# Patient Record
Sex: Female | Born: 1995 | Hispanic: Yes | Marital: Single | Smoking: Never smoker
Health system: Southern US, Community
[De-identification: ages and names within clinical notes are randomized; demographics above are authoritative.]

---

## 2015-04-16 ENCOUNTER — Ambulatory Visit (INDEPENDENT_AMBULATORY_CARE_PROVIDER_SITE_OTHER): Payer: 59 | Admitting: Family Medicine

## 2015-04-16 VITALS — BP 122/80 | HR 85 | Temp 98.1°F | Resp 16 | Ht 63.0 in | Wt 203.6 lb

## 2015-04-16 DIAGNOSIS — Z111 Encounter for screening for respiratory tuberculosis: Secondary | ICD-10-CM

## 2015-04-16 DIAGNOSIS — Z23 Encounter for immunization: Secondary | ICD-10-CM | POA: Diagnosis not present

## 2015-04-16 NOTE — Patient Instructions (Addendum)
To be a valid skin test you need to return in 48-72 hours which would be between about 1 PM Sunday and 1 PM Monday. We close at 4 PM on Sunday.  Return if problems   Influenza Vaccine (Flu Vaccine, Inactivated or Recombinant) 2014-2015: What You Need to Know 1. Why get vaccinated? Influenza ("flu") is a contagious disease that spreads around the [place] every winter, usually between October and May. Flu is caused by influenza viruses, and is spread mainly by coughing, sneezing, and close contact. Anyone can get flu, but the risk of getting flu is highest among children. Symptoms come on suddenly and may last several days. They can include:  fever/chills  sore throat  muscle aches  fatigue  cough  headache  runny or stuffy nose Flu can make some people much sicker than others. These people include young children, people 71 and older, pregnant women, and people with certain health conditions-such as heart, lung or kidney disease, nervous system disorders, or a weakened immune system. Flu vaccination is especially important for these people, and anyone in close contact with them. Flu can also lead to pneumonia, and make existing medical conditions worse. It can cause diarrhea and seizures in children. Each year thousands of people in the [place] die from flu, and many more are hospitalized. Flu vaccine is the best protection against flu and its complications. Flu vaccine also helps prevent spreading flu from person to person. 2. Inactivated and recombinant flu vaccines You are getting an injectable flu vaccine, which is either an "inactivated" or "recombinant" vaccine. These vaccines do not contain any live influenza virus. They are given by injection with a needle, and often called the "flu shot."  A different live, attenuated (weakened) influenza vaccine is sprayed into the nostrils. This vaccine is described in a separate Vaccine Information Statement. Flu vaccination is  recommended every year. Some children 6 months through 27 years of age might need two doses during one year. Flu viruses are always changing. Each year's flu vaccine is made to protect against 3 or 4 viruses that are likely to cause disease that year. Flu vaccine cannot prevent all cases of flu, but it is the best defense against the disease.  It takes about 2 weeks for protection to develop after the vaccination, and protection lasts several months to a year. Some illnesses that are not caused by influenza virus are often mistaken for flu. Flu vaccine will not prevent these illnesses. It can only prevent influenza. Some inactivated flu vaccine contains a very small amount of a mercury-based preservative called thimerosal. Studies have shown that thimerosal in vaccines is not harmful, but flu vaccines that do not contain a preservative are available. 3. Some people should not get this vaccine Tell the person who gives you the vaccine:  If you have any severe, life-threatening allergies. If you ever had a life-threatening allergic reaction after a dose of flu vaccine, or have a severe allergy to any part of this vaccine, including (for example) an allergy to gelatin, antibiotics, or eggs, you may be advised not to get vaccinated. Most, but not all, types of flu vaccine contain a small amount of egg protein.  If you ever had Guillain-Barr Syndrome (a severe paralyzing illness, also called GBS). Some people with a history of GBS should not get this vaccine. This should be discussed with your doctor.  If you are not feeling well. It is usually okay to get flu vaccine when you have a mild  illness, but you might be advised to wait until you feel better. You should come back when you are better. 4. Risks of a vaccine reaction With a vaccine, like any medicine, there is a chance of side effects. These are usually mild and go away on their own. Problems that could happen after any vaccine:  Brief fainting  spells can happen after any medical procedure, including vaccination. Sitting or lying down for about 15 minutes can help prevent fainting, and injuries caused by a fall. Tell your doctor if you feel dizzy, or have vision changes or ringing in the ears.  Severe shoulder pain and reduced range of motion in the arm where a shot was given can happen, very rarely, after a vaccination.  Severe allergic reactions from a vaccine are very rare, estimated at less than 1 in a million doses. If one were to occur, it would usually be within a few minutes to a few hours after the vaccination. Mild problems following inactivated flu vaccine:  soreness, redness, or swelling where the shot was given  hoarseness  sore, red or itchy eyes  cough  fever  aches  headache  itching  fatigue If these problems occur, they usually begin soon after the shot and last 1 or 2 days. Moderate problems following inactivated flu vaccine:  Young children who get inactivated flu vaccine and pneumococcal vaccine (PCV13) at the same time may be at increased risk for seizures caused by fever. Ask your doctor for more information. Tell your doctor if a child who is getting flu vaccine has ever had a seizure. Inactivated flu vaccine does not contain live flu virus, so you cannot get the flu from this vaccine. As with any medicine, there is a very remote chance of a vaccine causing a serious injury or death. The safety of vaccines is always being monitored. For more information, visit: http://floyd.org/ 5. What if there is a serious reaction? What should I look for?  Look for anything that concerns you, such as signs of a severe allergic reaction, very high fever, or behavior changes. Signs of a severe allergic reaction can include hives, swelling of the face and throat, difficulty breathing, a fast heartbeat, dizziness, and weakness. These would start a few minutes to a few hours after the vaccination. What  should I do?  If you think it is a severe allergic reaction or other emergency that can't wait, call 9-1-1 and get the person to the nearest hospital. Otherwise, call your doctor.  Afterward, the reaction should be reported to the Vaccine Adverse Event Reporting System (VAERS). Your doctor should file this report, or you can do it yourself through the VAERS web site at www.vaers.LAgents.no, or by calling 1-501-138-7317. VAERS does not give medical advice. 6. The National Vaccine Injury Compensation Program The [employer] Vaccine Injury Compensation Program (VICP) is a federal program that was created to compensate people who may have been injured by certain vaccines. Persons who believe they may have been injured by a vaccine can learn about the program and about filing a claim by calling 1-(978)795-9622 or visiting the VICP website at SpiritualWord.at. There is a time limit to file a claim for compensation. 7. How can I learn more?  Ask your health care provider.  Call your local or state health department.  Contact the Centers for Disease Control and Prevention (CDC):  Call 907-480-8233 (1-800-CDC-INFO) or  Visit CDC's website at BiotechRoom.com.[facility] Vaccine Information Statement (Interim) Inactivated Influenza Vaccine (03/25/2013) Document Released: 05/18/2006 Document  Revised: 12/08/2013 Document Reviewed: 07/11/2013 [facility] Patient Information 2015 [place], [place]. This information is not intended to replace advice given to you by your health care provider. Make sure you discuss any questions you have with your health care provider.

## 2015-04-16 NOTE — Progress Notes (Signed)
  Subjective:  Patient ID: Christina Whitney, female    DOB: [birth year]  Age: 19 y.o. MRN: [id number]  Flu shot and TB skin test Patient is here for her flu shot and TB skin test. 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 She is basically healthy.  Previous surgery was something on her neck when she was a about 19 years old. She has not had any major medical illnesses. Not on any regular medicines, not allergic to medicines, does not smoke, drink, or use drugs. She is sexually involved with a regular boyfriend and uses condoms for protection.  No history of TB exposure. Only international travel has been to the [place] where she grew out. Parents are living and well.   Objective:   Healthy-appearing young lady in no acute distress. Neck supple without nodes. Throat clear. Chest clear. Heart regular without murmurs  Assessment & Plan:   Assessment:  TB skin test and flu shot Plan:  Her instructions  There are no Patient Instructions on file for this visit.   HOPPER,DAVID, MD 04/16/2015

## 2015-04-18 ENCOUNTER — Ambulatory Visit (INDEPENDENT_AMBULATORY_CARE_PROVIDER_SITE_OTHER): Payer: 59 | Admitting: *Deleted

## 2015-04-18 DIAGNOSIS — Z111 Encounter for screening for respiratory tuberculosis: Secondary | ICD-10-CM

## 2015-04-18 LAB — TB SKIN TEST
Induration: 0 mm
TB Skin Test: NEGATIVE

## 2015-09-06 ENCOUNTER — Ambulatory Visit (INDEPENDENT_AMBULATORY_CARE_PROVIDER_SITE_OTHER): Payer: BLUE CROSS/BLUE SHIELD

## 2015-09-06 ENCOUNTER — Ambulatory Visit (INDEPENDENT_AMBULATORY_CARE_PROVIDER_SITE_OTHER): Payer: BLUE CROSS/BLUE SHIELD | Admitting: Family Medicine

## 2015-09-06 VITALS — BP 118/68 | HR 102 | Temp 99.0°F | Resp 16 | Ht 64.0 in | Wt 207.0 lb

## 2015-09-06 DIAGNOSIS — R109 Unspecified abdominal pain: Secondary | ICD-10-CM

## 2015-09-06 DIAGNOSIS — N390 Urinary tract infection, site not specified: Secondary | ICD-10-CM

## 2015-09-06 DIAGNOSIS — R1084 Generalized abdominal pain: Secondary | ICD-10-CM

## 2015-09-06 DIAGNOSIS — R1031 Right lower quadrant pain: Secondary | ICD-10-CM

## 2015-09-06 DIAGNOSIS — Z113 Encounter for screening for infections with a predominantly sexual mode of transmission: Secondary | ICD-10-CM

## 2015-09-06 DIAGNOSIS — A499 Bacterial infection, unspecified: Secondary | ICD-10-CM

## 2015-09-06 DIAGNOSIS — R140 Abdominal distension (gaseous): Secondary | ICD-10-CM

## 2015-09-06 DIAGNOSIS — R10A0 Flank pain, unspecified side: Secondary | ICD-10-CM

## 2015-09-06 LAB — POCT URINALYSIS DIP (MANUAL ENTRY)
Bilirubin, UA: NEGATIVE
Blood, UA: NEGATIVE
Glucose, UA: NEGATIVE
Ketones, POC UA: NEGATIVE
Nitrite, UA: NEGATIVE
Protein Ur, POC: NEGATIVE
Spec Grav, UA: 1.015
Urobilinogen, UA: 0.2
pH, UA: 8.5

## 2015-09-06 LAB — COMPLETE METABOLIC PANEL WITHOUT GFR
AST: 15 U/L (ref 10–30)
Albumin: 4.4 g/dL (ref 3.6–5.1)
Alkaline Phosphatase: 70 U/L (ref 33–115)
Calcium: 9.8 mg/dL (ref 8.6–10.2)
Chloride: 100 mmol/L (ref 98–110)
GFR, Est African American: >89 mL/min (ref >=60)
GFR, Est Non African American: >89 mL/min (ref >=60)
Potassium: 3.8 mmol/L (ref 3.5–5.3)

## 2015-09-06 LAB — POCT CBC
Granulocyte percent: 71.4 %G (ref 37–80)
HCT, POC: 43.9 % (ref 37.7–47.9)
Hemoglobin: 14.9 g/dL (ref 12.2–16.2)
Lymph, poc: 2.7 (ref 0.6–3.4)
MCH, POC: 29.9 pg (ref 27–31.2)
MCHC: 34 g/dL (ref 31.8–35.4)
MCV: 87.8 fL (ref 80–97)
MID (cbc): 0.2 (ref 0–0.9)
MPV: 6.9 fL (ref 0–99.8)
POC Granulocyte: 7.4 — AB (ref 2–6.9)
POC LYMPH PERCENT: 26.4 % (ref 10–50)
POC MID %: 2.2 %M (ref 0–12)
Platelet Count, POC: 346 10*3/uL (ref 142–424)
RBC: 5 M/uL (ref 4.04–5.48)
RDW, POC: 13 %
WBC: 10.3 10*3/uL — AB (ref 4.6–10.2)

## 2015-09-06 LAB — COMPLETE METABOLIC PANEL WITH GFR
ALT: 20 U/L (ref 6–29)
BUN: 10 mg/dL (ref 7–25)
CO2: 29 mmol/L (ref 20–31)
Creat: 0.65 mg/dL (ref 0.50–1.10)
Glucose, Bld: 94 mg/dL (ref 65–99)
Sodium: 137 mmol/L (ref 135–146)
Total Bilirubin: 0.4 mg/dL (ref 0.2–1.2)
Total Protein: 7.1 g/dL (ref 6.1–8.1)

## 2015-09-06 LAB — POC MICROSCOPIC URINALYSIS (UMFC): Mucus: ABSENT

## 2015-09-06 LAB — LIPASE: Lipase: 38 U/L (ref 7–60)

## 2015-09-06 LAB — POCT URINE PREGNANCY: Preg Test, Ur: NEGATIVE

## 2015-09-06 MED ORDER — PHENAZOPYRIDINE HCL 100 MG PO TABS
100.0000 mg | ORAL_TABLET | Freq: Three times a day (TID) | ORAL | Status: DC | PRN
Start: 2015-09-06 — End: 2017-11-02

## 2015-09-06 MED ORDER — CEPHALEXIN 500 MG PO CAPS: 500.0000 mg | ORAL_CAPSULE | Freq: Two times a day (BID) | ORAL | Status: DC

## 2015-09-06 NOTE — Patient Instructions (Addendum)
Cephalexin tablets or capsules What is this medicine? CEPHALEXIN (sef a LEX in) is a cephalosporin antibiotic. It is used to treat certain kinds of bacterial infections It will not work for colds, flu, or other viral infections. This medicine may be used for other purposes; ask your health care provider or pharmacist if you have questions. What should I tell my health care provider before I take this medicine? They need to know if you have any of these conditions: -kidney disease -stomach or intestine problems, especially colitis -an unusual or allergic reaction to cephalexin, other cephalosporins, penicillins, other antibiotics, medicines, foods, dyes or preservatives -pregnant or trying to get pregnant -breast-feeding How should I use this medicine? Take this medicine by mouth with a full glass of water. Follow the directions on the prescription label. This medicine can be taken with or without food. Take your medicine at regular intervals. Do not take your medicine more often than directed. Take all of your medicine as directed even if you think you are better. Do not skip doses or stop your medicine early. Talk to your pediatrician regarding the use of this medicine in children. While this drug may be prescribed for selected conditions, precautions do apply. Overdosage: If you think you have taken too much of this medicine contact a poison control center or emergency room at once. NOTE: This medicine is only for you. Do not share this medicine with others. What if I miss a dose? If you miss a dose, take it as soon as you can. If it is almost time for your next dose, take only that dose. Do not take double or extra doses. There should be at least 4 to 6 hours between doses. What may interact with this medicine? -probenecid -some other antibiotics This list may not describe all possible interactions. Give your health care provider a list of all the medicines, herbs, non-prescription drugs, or  dietary supplements you use. Also tell them if you smoke, drink alcohol, or use illegal drugs. Some items may interact with your medicine. What should I watch for while using this medicine? Tell your doctor or health care professional if your symptoms do not begin to improve in a few days. Do not treat diarrhea with over the counter products. Contact your doctor if you have diarrhea that lasts more than 2 days or if it is severe and watery. If you have diabetes, you may get a false-positive result for sugar in your urine. Check with your doctor or health care professional. What side effects may I notice from receiving this medicine? Side effects that you should report to your doctor or health care professional as soon as possible: -allergic reactions like skin rash, itching or hives, swelling of the face, lips, or tongue -breathing problems -pain or trouble passing urine -redness, blistering, peeling or loosening of the skin, including inside the mouth -severe or watery diarrhea -unusually weak or tired -yellowing of the eyes, skin Side effects that usually do not require medical attention (report to your doctor or health care professional if they continue or are bothersome): -gas or heartburn -genital or anal irritation -headache -joint or muscle pain -nausea, vomiting This list may not describe all possible side effects. Call your doctor for medical advice about side effects. You may report side effects to FDA at 1-800-FDA-1088. Where should I keep my medicine? Keep out of the reach of children. Store at room temperature between 59 and 86 degrees F (15 and 30 degrees C). Throw away any unused  medicine after the expiration date. NOTE: This sheet is a summary. It may not cover all possible information. If you have questions about this medicine, talk to your doctor, pharmacist, or health care provider.    2016, Elsevier/Gold Standard. (2007-10-28 17:09:13) Abdominal Pain, Adult Many  things can cause abdominal pain. Usually, abdominal pain is not caused by a disease and will improve without treatment. It can often be observed and treated at home. Your health care provider will do a physical exam and possibly order blood tests and X-rays to help determine the seriousness of your pain. However, in many cases, more time must pass before a clear cause of the pain can be found. Before that point, your health care provider may not know if you need more testing or further treatment. HOME CARE INSTRUCTIONS Monitor your abdominal pain for any changes. The following actions may help to alleviate any discomfort you are experiencing:  Only take over-the-counter or prescription medicines as directed by your health care provider.  Do not take laxatives unless directed to do so by your health care provider.  Try a clear liquid diet (broth, tea, or water) as directed by your health care provider. Slowly move to a bland diet as tolerated. SEEK MEDICAL CARE IF:  You have unexplained abdominal pain.  You have abdominal pain associated with nausea or diarrhea.  You have pain when you urinate or have a bowel movement.  You experience abdominal pain that wakes you in the night.  You have abdominal pain that is worsened or improved by eating food.  You have abdominal pain that is worsened with eating fatty foods.  You have a fever. SEEK IMMEDIATE MEDICAL CARE IF:  Your pain does not go away within 2 hours.  You keep throwing up (vomiting).  Your pain is felt only in portions of the abdomen, such as the right side or the left lower portion of the abdomen.  You pass bloody or black tarry stools. MAKE SURE YOU:  Understand these instructions.  Will watch your condition.  Will get help right away if you are not doing well or get worse.   This information is not intended to replace advice given to you by your health care provider. Make sure you discuss any questions you have with  your health care provider.   Document Released: 05/03/2005 Document Revised: 04/14/2015 Document Reviewed: 04/02/2013 Elsevier Interactive Patient Education [employer] Because you received an x-ray today, you will receive an invoice from [facility]. Please contact [facility] at [phone] with questions or concerns regarding your invoice. Our billing staff will not be able to assist you with those questions.

## 2015-09-06 NOTE — Progress Notes (Signed)
 Chief Complaint:  Chief Complaint  Patient presents with  . Abdominal Pain    lower right , x 1 day     HPI: Christina Whitney is a 20 y.o. female who reports to [facility] today complaining of diffuse right upper and lower quadrant pain which started last last night, she had diffuse  blaoting nad pain yesterday and then ocalized to the right side. She has no nause avomrint fevers or chills or urinary or vaginal symptoms. She Has someback pain. She has had flatus and also BM  Today. Food doe snto make it worse or better. SHe has not tired anythign for this. She was given sour soda with lemon without relief . She doe snot have constipation. She is She has sex but uses condoms No prior hx of STD.  LMP 12/12, irregular, sexually active.  Again no fevers, chill, nausea, vomiting.   History reviewed. No pertinent past medical history. History reviewed. No pertinent past surgical history. Social History   Social History  . Marital Status: Single    Spouse Name: N/A  . Number of Children: N/A  . Years of Education: N/A   Social History Main Topics  . Smoking status: Never Smoker   . Smokeless tobacco: Never Used  . Alcohol Use: No  . Drug Use: No  . Sexual Activity: Not Asked   Other Topics Concern  . None   Social History Narrative   History reviewed. No pertinent family history. No Known Allergies Prior to Admission medications   Not on File     ROS: The patient denies fevers, chills, night sweats, unintentional weight loss, chest pain, palpitations, wheezing, dyspnea on exertion, nausea, vomiting, abdominal pain, dysuria, hematuria, melena, numbness, weakness, or tingling.   All other systems have been reviewed and were otherwise negative with the exception of those mentioned in the HPI and as above.    PHYSICAL EXAM: Filed Vitals:   09/06/15 1415  BP: 118/68  Pulse: 102  Temp: 99 F (37.2 C)  Resp: 16   Body mass index is 35.51 kg/(m^2).   General: Alert, no acute  distress HEENT:  Normocephalic, atraumatic, oropharynx patent. EOMI, PERRLA Cardiovascular:  Regular rate and rhythm, no rubs murmurs or gallops.  No Carotid bruits, radial pulse intact. No pedal edema.  Respiratory: Clear to auscultation bilaterally.  No wheezes, rales, or rhonchi.  No cyanosis, no use of accessory musculature Abdominal: No organomegaly, abdomen is soft and non-tender, positive bowel sounds. No masses. Skin: No rashes. Neurologic: Facial musculature symmetric. Psychiatric: Patient acts appropriately throughout our interaction. Lymphatic: No cervical or submandibular lymphadenopathy Musculoskeletal: Gait intact. No edema, tenderness + bilateral flank pain   LABS: Results for orders placed or performed in visit on 09/06/15  POCT CBC  Result Value Ref Range   WBC 10.3 (A) 4.6 - 10.2 K/uL   Lymph, poc 2.7 0.6 - 3.4   POC LYMPH PERCENT 26.4 10 - 50 %L   MID (cbc) 0.2 0 - 0.9   POC MID % 2.2 0 - 12 %M   POC Granulocyte 7.4 (A) 2 - 6.9   Granulocyte percent 71.4 37 - 80 %G   RBC 5.00 4.04 - 5.48 M/uL   Hemoglobin 14.9 12.2 - 16.2 g/dL   HCT, POC 84.1 32.4 - 47.9 %   MCV 87.8 80 - 97 fL   MCH, POC 29.9 27 - 31.2 pg   MCHC 34.0 31.8 - 35.4 g/dL   RDW, POC 40.1 %  Platelet Count, POC 346 142 - 424 K/uL   MPV 6.9 0 - 99.8 fL  POCT Microscopic Urinalysis ([facility])  Result Value Ref Range   WBC,UR,HPF,POC Few (A) None WBC/hpf   RBC,UR,HPF,POC None None RBC/hpf   Bacteria Few (A) None, Too numerous to count   Mucus Absent Absent   Epithelial Cells, UR Per Microscopy Moderate (A) None, Too numerous to count cells/hpf   Amorphous many   POCT urinalysis dipstick  Result Value Ref Range   Color, UA yellow yellow   Clarity, UA cloudy (A) clear   Glucose, UA negative negative   Bilirubin, UA negative negative   Ketones, POC UA negative negative   Spec Grav, UA 1.015    Blood, UA negative negative   pH, UA 8.5    Protein Ur, POC negative negative   Urobilinogen, UA 0.2     Nitrite, UA Negative Negative   ukocytes, UA small (1+) (A) Negative  POCT urine pregnancy  Result Value Ref Range   Preg Test, Ur Negative Negative     EKG/XRAY:   Primary read interpreted by Dr. Conley Rolls at [facility].   ASSESSMENT/PLAN: Encounter Diagnoses  Name Primary?  . Generalized abdominal pain   . Acute right lower quadrant pain   . Screening for STD (sexually transmitted disease)   . Flank pain   . Bloating   . UTI (urinary tract infection), bacterial Yes   Urine cx pending Rx Keflex, pyridium PRecautions given to go to ER prn for  Diffuse right quadrant abd pain Fu prn   Gross sideeffects, risk and benefits, and alternatives of medications d/w patient. Patient is aware that all medications have potential sideeffects and we are unable to predict every sideeffect or drug-drug interaction that may occur.    DO  09/06/2015 3:22 PM

## 2015-09-07 LAB — GC/CHLAMYDIA PROBE AMP
CT Probe RNA: NOT DETECTED
GC Probe RNA: NOT DETECTED

## 2015-09-08 ENCOUNTER — Telehealth: Payer: Self-pay | Admitting: Family Medicine

## 2015-09-08 LAB — URINE CULTURE: Colony Count: 10000

## 2015-09-08 NOTE — Telephone Encounter (Signed)
Spoke with patient about labs, feeling better.

## 2016-11-23 ENCOUNTER — Ambulatory Visit (INDEPENDENT_AMBULATORY_CARE_PROVIDER_SITE_OTHER): Payer: BLUE CROSS/BLUE SHIELD | Admitting: Emergency Medicine

## 2016-11-23 ENCOUNTER — Ambulatory Visit (INDEPENDENT_AMBULATORY_CARE_PROVIDER_SITE_OTHER): Payer: BLUE CROSS/BLUE SHIELD

## 2016-11-23 VITALS — BP 133/83 | HR 115 | Temp 98.9°F | Resp 16 | Ht 64.0 in | Wt 225.6 lb

## 2016-11-23 DIAGNOSIS — M25571 Pain in right ankle and joints of right foot: Secondary | ICD-10-CM

## 2016-11-23 NOTE — Patient Instructions (Addendum)
     IF you received an x-ray today, you will receive an invoice from [facility]. Please contact [facility] at [phone] with questions or concerns regarding your invoice.   IF you received labwork today, you will receive an invoice from [place]. Please contact [facility] at [phone] with questions or concerns regarding your invoice.   Our billing staff will not be able to assist you with questions regarding bills from these companies.  You will be contacted with the lab results as soon as they are available. The fastest way to get your results is to activate your My Chart account. Instructions are located on the last page of this paperwork. If you have not heard from [place] regarding the results in 2 weeks, please contact this office.      Dolor del pie (Foot Pain) El dolor del pie puede tener muchas causas. Algunas causas frecuentes son las siguientes:  Una lesin.  Un esguince.  Artritis.  Ampollas.  Juanetes. INSTRUCCIONES PARA EL CUIDADO EN EL HOGAR Est atento a cualquier cambio en los sntomas. Tome estas medidas para aliviar las molestias:  Si se lo indican, aplique hielo sobre la zona afectada:  Ponga el hielo en una bolsa plstica.  Coloque una toalla entre la piel y la bolsa de hielo.  Deje el hielo durante 15 a 20 minutos, 3 a 4veces por da, durante 2das.  Tome los medicamentos de venta libre y los recetados solamente como se lo haya indicado el mdico.  Use zapatos cmodos y anatmicos que tengan buen calce. No use zapatos con tacones altos.  No permanezca de pie ni camine durante largos perodos.  No levante mucho peso. Esto puede agregar presin en el pie.  Haga ejercicios de estiramiento para Engineer, materials del pie y la rigidez como se lo haya indicado el mdico.  Hgase masajes suaves en el pie.  Mantenga los pies, limpios y secos. SOLICITE ATENCIN MDICA SI:  El dolor no mejora despus de [address] de cuidados  personales.  El dolor [place].  No puede apoyar el peso en el pie. SOLICITE ATENCIN MDICA DE INMEDIATO SI:  El pie se le adormece o tiene hormigueo.  El pie o los dedos de ese pie se le hinchan.  El pie o los dedos de ese pie se tornan de color blanco o [place].  Tiene el pie enrojecido y caliente al tacto. Esta informacin no tiene Theme park manager el consejo del mdico. Asegrese de hacerle al mdico cualquier pregunta que tenga. Document Released: 11/15/2015 Document Revised: 11/15/2015 Document Reviewed: 08/19/2014 Elsevier Interactive Patient Education  2017 [employer].

## 2016-11-23 NOTE — Progress Notes (Signed)
Rudi Heap 21 y.o.   Chief Complaint  Patient presents with  . Ankle Injury    Sprained ankle in August, has had intermittant pain since  . Immunizations    Pt in studying abroad soon, she wants to be sure her immunizations are UTD     HISTORY OF PRESENT ILLNESS: This is a 21 y.o. female complaining of persistent pain to right foot since twisting it last August.  HPI   Prior to Admission medications   Medication Sig Start Date End Date Taking? Authorizing Provider  cephALEXin (KEFLEX) 500 MG capsule Take 1 capsule (500 mg total) by mouth 2 (two) times daily. Patient not taking: Reported on 11/23/2016 09/06/15   Thao P Le, DO  phenazopyridine (PYRIDIUM) 100 MG tablet Take 1 tablet (100 mg total) by mouth 3 (three) times daily as needed for pain. Patient not taking: Reported on 11/23/2016 09/06/15   Thao P Le, DO    No Known Allergies  There are no active problems to display for this patient.   No past medical history on file.  No past surgical history on file.  Social History   Social History  . Marital status: Single    Spouse name: N/A  . Number of children: N/A  . Years of education: N/A   Occupational History  . Not on file.   Social History Main Topics  . Smoking status: Never Smoker  . Smokeless tobacco: Never Used  . Alcohol use No  . Drug use: No  . Sexual activity: Not on file   Other Topics Concern  . Not on file   Social History Narrative  . No narrative on file    No family history on file.   Review of Systems  Constitutional: Negative.  Negative for chills and fever.  HENT: Negative.   Eyes: Negative.   Respiratory: Negative.  Negative for cough and shortness of breath.   Cardiovascular: Negative.  Negative for chest pain and palpitations.  Gastrointestinal: Negative for abdominal pain, diarrhea, nausea and vomiting.  Musculoskeletal: Positive for joint pain (right foot pain).  Skin: Negative for rash.  Neurological: Negative.   Negative for dizziness and headaches.  Endo/Heme/Allergies: Negative.   All other systems reviewed and are negative.  Vitals:   11/23/16 1353  BP: 133/83  Pulse: (!) 115  Resp: 16  Temp: 98.9 F (37.2 C)     Physical Exam  Constitutional: She is oriented to person, place, and time. She appears well-developed and well-nourished.  HENT:  Head: Normocephalic and atraumatic.  Eyes: EOM are normal. Pupils are equal, round, and reactive to light.  Neck: Normal range of motion. Neck supple.  Cardiovascular: Normal rate and regular rhythm.   Pulmonary/Chest: Effort normal and breath sounds normal.  Abdominal: Soft. She exhibits no distension. There is no tenderness. Hernia: .assesspla.  Musculoskeletal:  Right ankle/foot: no erythema or bruising, mild tenderness laterally; NVI with FROM  Neurological: She is alert and oriented to person, place, and time. She displays normal reflexes. No sensory deficit. She exhibits normal muscle tone.  Skin: Skin is warm and dry.  Psychiatric: She has a normal mood and affect.  Vitals reviewed.   Xray reviewed: No bony abnormality.  ASSESSMENT & PLAN:  Harlow was seen today for ankle injury and immunizations.  Diagnoses and all orders for this visit:  Pain in joint involving right ankle and foot -     DG Foot Complete Right; Future -     Ambulatory referral to Podiatry  Patient Instructions       IF you received an x-ray today, you will receive an invoice from [facility]. Please contact [facility] at [phone] with questions or concerns regarding your invoice.   IF you received labwork today, you will receive an invoice from [facility]. Please contact [facility] at [phone] with questions or concerns regarding your invoice.   Our billing staff will not be able to assist you with questions regarding bills from these companies.  You will be contacted with the lab results as soon as they are available. The  fastest way to get your results is to activate your My Chart account. Instructions are located on the last page of this paperwork. If you have not heard from [place] regarding the results in 2 weeks, please contact this office.      Dolor del pie (Foot Pain) El dolor del pie puede tener muchas causas. Algunas causas frecuentes son las siguientes:  Una lesin.  Un esguince.  Artritis.  Ampollas.  Juanetes. INSTRUCCIONES PARA EL CUIDADO EN EL HOGAR Est atento a cualquier cambio en los sntomas. Tome estas medidas para aliviar las molestias:  Si se lo indican, aplique hielo sobre la zona afectada:  Ponga el hielo en una bolsa plstica.  Coloque una toalla entre la piel y la bolsa de hielo.  Deje el hielo durante 15 a 20 minutos, 3 a 4veces por da, durante 2das.  Tome los medicamentos de venta libre y los recetados solamente como se lo haya indicado el mdico.  Use zapatos cmodos y anatmicos que tengan buen calce. No use zapatos con tacones altos.  No permanezca de pie ni camine durante largos perodos.  No levante mucho peso. Esto puede agregar presin en el pie.  Haga ejercicios de estiramiento para Engineer, materials del pie y la rigidez como se lo haya indicado el mdico.  Hgase masajes suaves en el pie.  Mantenga los pies, limpios y secos. SOLICITE ATENCIN MDICA SI:  El dolor no mejora despus de [address] de cuidados personales.  El dolor [place].  No puede apoyar el peso en el pie. SOLICITE ATENCIN MDICA DE INMEDIATO SI:  El pie se le adormece o tiene hormigueo.  El pie o los dedos de ese pie se le hinchan.  El pie o los dedos de ese pie se tornan de color blanco o [place].  Tiene el pie enrojecido y caliente al tacto. Esta informacin no tiene Theme park manager el consejo del mdico. Asegrese de hacerle al mdico cualquier pregunta que tenga. Document Released: 11/15/2015 Document Revised: 11/15/2015 Document Reviewed: 08/19/2014 Elsevier  Interactive Patient Education  2017 Elsevier Inc.      Edwina Barth, MD [facility]

## 2016-12-18 ENCOUNTER — Ambulatory Visit (INDEPENDENT_AMBULATORY_CARE_PROVIDER_SITE_OTHER): Payer: BLUE CROSS/BLUE SHIELD | Admitting: Podiatry

## 2016-12-18 ENCOUNTER — Ambulatory Visit (INDEPENDENT_AMBULATORY_CARE_PROVIDER_SITE_OTHER): Payer: BLUE CROSS/BLUE SHIELD

## 2016-12-18 ENCOUNTER — Encounter: Payer: Self-pay | Admitting: Podiatry

## 2016-12-18 DIAGNOSIS — M7750 Other enthesopathy of unspecified foot: Secondary | ICD-10-CM

## 2016-12-18 DIAGNOSIS — M25572 Pain in left ankle and joints of left foot: Principal | ICD-10-CM

## 2016-12-18 DIAGNOSIS — M25571 Pain in right ankle and joints of right foot: Secondary | ICD-10-CM | POA: Diagnosis not present

## 2016-12-18 DIAGNOSIS — M779 Enthesopathy, unspecified: Secondary | ICD-10-CM

## 2016-12-18 MED ORDER — TRIAMCINOLONE ACETONIDE 10 MG/ML IJ SUSP
10.0000 mg | Freq: Once | INTRAMUSCULAR | Status: AC
  Administered 2016-12-18: 10 mg

## 2016-12-18 MED ORDER — DICLOFENAC SODIUM 75 MG PO TBEC: 75.0000 mg | DELAYED_RELEASE_TABLET | Freq: Two times a day (BID) | ORAL | 2 refills | Status: DC

## 2016-12-20 NOTE — Progress Notes (Signed)
Subjective:    Patient ID: Christina HeapAnnette Shackleford, female   DOB: 21 y.o.   MRN: [id number]   HPI patient presents stating she sprained her ankle last August and was doing pretty well and then started to develop pain in the ankle again and states that it's just been chronically inflamed and sore and makes it hard to walk    Review of Systems  All other systems reviewed and are negative.       Objective:  Physical Exam  Cardiovascular: Intact distal pulses.   Musculoskeletal: Normal range of motion.  Neurological: She is alert.  Skin: Skin is warm.  Nursing note and vitals reviewed.  neurovascular status intact muscle strength was adequate range of motion within normal limits with patient found to have exquisite discomfort within the sinus tarsi right. There is mild edema in the ankle but it is nonpitting and there is negative Homans sign noted. Patient's found have good digital perfusion and is well oriented 3     Assessment:    History of sprained ankle right with inflammatory capsulitis of the sinus tarsi which is probably due to change in gait and chronic pressure on the ankle joint     Plan:    H&P and condition reviewed. I injected the sinus tarsi right 3 mg Kenalog 5 mg Xylocaine advised on physical therapy compression and patient will be seen back as needed   X-ray indicates no indication of fracture or diastases injury

## 2016-12-29 ENCOUNTER — Encounter: Payer: Self-pay | Admitting: Internal Medicine

## 2016-12-29 ENCOUNTER — Ambulatory Visit (INDEPENDENT_AMBULATORY_CARE_PROVIDER_SITE_OTHER): Payer: BLUE CROSS/BLUE SHIELD | Admitting: Internal Medicine

## 2016-12-29 DIAGNOSIS — Z7189 Other specified counseling: Secondary | ICD-10-CM

## 2016-12-29 DIAGNOSIS — Z0000 Encounter for general adult medical examination without abnormal findings: Secondary | ICD-10-CM

## 2016-12-29 DIAGNOSIS — Z7184 Encounter for health counseling related to travel: Secondary | ICD-10-CM

## 2016-12-29 DIAGNOSIS — Z23 Encounter for immunization: Secondary | ICD-10-CM

## 2016-12-29 DIAGNOSIS — Z789 Other specified health status: Secondary | ICD-10-CM

## 2016-12-29 DIAGNOSIS — Z9189 Other specified personal risk factors, not elsewhere classified: Secondary | ICD-10-CM

## 2016-12-29 MED ORDER — CIPROFLOXACIN HCL 500 MG PO TABS: 500.0000 mg | ORAL_TABLET | Freq: Two times a day (BID) | ORAL | 0 refills | Status: DC

## 2016-12-29 NOTE — Progress Notes (Signed)
Subjective:   Christina Whitney is a 21 y.o. female who presents to the Infectious Disease clinic for travel consultation. Planned departure date: August 2018          Planned return date: 1 semester Countries of travel: [place] Areas in country: urban   Accommodations: university campus Purpose of travel: semester abroad Prior travel out of [place]: yes     Objective:   Medications: none    Assessment:   No contraindications to travel. none     Plan:    Issues discussed: environmental concerns, freshwater swimming, future shots, insect-borne illnesses, malaria, MVA safety, rabies, safe food/water, traveler's diarrhea, website/handouts for more information, what to do if ill upon return, what to do if ill while there and Yellow Fever. Immunizations recommended: Typhoid (parenteral). Malaria prophylaxis: not indicated Traveler's diarrhea prophylaxis: ciprofloxacin. Total duration of visit: 1 Hour. Total time spent on education, counseling, coordination of care: 30 Minutes.

## 2016-12-29 NOTE — Patient Instructions (Signed)
[facility]                [address]                   [place], [place] [id number]                      Phone: [phone]                        Fax: [phone]   Planned departure date: August 2018          Planned return date: 1 semester Countries of travel: [place]   Guidelines for the Prevention & Treatment of Traveler's Diarrhea  Prevention: "Boil it, Peel it, [place] it, or Forget it"   the fewer chances -> lower risk: try to stick to food & water precautions as much as possible"   If it's "piping hot"; it is probably okay, if not, it may not be   Treatment   1) You should always take care to drink lots of fluids in order to avoid dehydration   2) You should bring medications with you in case you come down with a case of diarrhea   3) OTC = bring pepto-bismol - can take with initial abdominal symptoms;                    Imodium - can help slow down your intestinal tract, can help relief cramps                    and diarrhea, can take if no bloody diarrhea  Use ciprofloxacin if needed for traveler's diarrhea  Guidelines for the Prevention of Malaria  Avoidance:  -fewer mosquito bites = lower risk. Mosquitos can bite at night as well as daytime  -cover up (long sleeve clothing), mosquito nets, screens  -Insect repellent for your skin ( DEET containing lotion > 20%): for clothes ( permethrin spray)    Immunizations received today: Typhoid (parenteral)  Future immunizations, if indicated none indicated   Prior to travel:  1) Be sure to pick up appropriate prescriptions, including medicine you take daily. Do not expect to be able to fill your prescriptions abroad.  2) Strongly consider obtaining traveler's insurance, including emergency evacuation insurance. Most plans in the [place] do not cover participants abroad. (see below for resources)  3) Register at the appropriate [place] embassy or consulate with  travel dates so they are aware of your presence in-country and for helpful advice during travel using the [employer] Traveler Enrollment Program (STEP, GuyGalaxy.sihttps://step.state.gov/step).  4) Leave contact information with a relative or friend.  5) Keep a Corporate treasurerphotocopy passport, credit cards in case they become lost or stolen  6) Inform your credit card company that you will be travelling abroad   During travel:  1) If you become ill and need medical advice, the U.S[place] WellPointembassy website of the country you are traveling in general provides a list of English speaking doctors.  We are also available on MyChart for remote consultation if you register prior to travel. 2) Avoid motorcycles or scooters when at all possible. Traffic laws in many countries are lax and accidents occur frequently.  3) Do not take any unnecessary risks that you wouldn't do at home.   Resources:  -Country specific information: http://www.church.org/www.cdc.gov/travel or GuyGalaxy.sihttps://step.state.gov/step  -Travel Supplies (DEET, mosquito nets):  REI, [employer] Goods store, [employer], [place] insurance options: Xenia.com; http://clayton-rivera.info/; travelguard.com or Good Pilgrim's Pride, gninsurance.com or info@gninsurance .com, [id number].   Post Travel:  If you return from your trip ill, call your primary care doctor or our travel clinic @ [phone].   Enjoy your trip and know that with proper pre-travel preparation, most people have an enjoyable and uninterrupted trip!

## 2017-01-08 ENCOUNTER — Ambulatory Visit (INDEPENDENT_AMBULATORY_CARE_PROVIDER_SITE_OTHER): Payer: BLUE CROSS/BLUE SHIELD | Admitting: Podiatry

## 2017-01-08 ENCOUNTER — Encounter: Payer: Self-pay | Admitting: Podiatry

## 2017-01-08 DIAGNOSIS — M7750 Other enthesopathy of unspecified foot: Secondary | ICD-10-CM

## 2017-01-08 NOTE — Progress Notes (Signed)
Subjective:    Patient ID: Christina HeapAnnette Whitney, female   DOB: 21 y.o.   MRN: [id number]   HPI patient states that she's feeling a lot better    ROS      Objective:  Physical Exam neurovascular status intact negative Homans sign was noted with patient's right ankle doing better with pain still noted upon deep palpation but quite a bit improved     Assessment:  Improved sinus tarsitis       Plan:     Advised on physical therapy supportive shoe gear therapy and continued range of motion exercises. Reappoint to recheck

## 2017-11-02 ENCOUNTER — Ambulatory Visit (INDEPENDENT_AMBULATORY_CARE_PROVIDER_SITE_OTHER): Payer: BLUE CROSS/BLUE SHIELD

## 2017-11-02 ENCOUNTER — Ambulatory Visit: Payer: BLUE CROSS/BLUE SHIELD | Admitting: Physician Assistant

## 2017-11-02 ENCOUNTER — Encounter: Payer: Self-pay | Admitting: Physician Assistant

## 2017-11-02 ENCOUNTER — Other Ambulatory Visit: Payer: Self-pay

## 2017-11-02 VITALS — BP 120/66 | HR 103 | Temp 98.2°F | Ht 64.0 in | Wt 215.8 lb

## 2017-11-02 DIAGNOSIS — R079 Chest pain, unspecified: Secondary | ICD-10-CM | POA: Diagnosis not present

## 2017-11-02 DIAGNOSIS — R0789 Other chest pain: Secondary | ICD-10-CM | POA: Diagnosis not present

## 2017-11-02 LAB — POCT URINE PREGNANCY: PREG TEST UR: NEGATIVE

## 2017-11-02 MED ORDER — MELOXICAM 15 MG PO TABS: 15.0000 mg | ORAL_TABLET | Freq: Every day | ORAL | 0 refills | Status: AC

## 2017-11-02 NOTE — Progress Notes (Signed)
11/02/2017 3:39 PM   DOB: 03-Oct-1995 / MRN: [id number]  SUBJECTIVE:  Christina Whitney is a 22 y.o. female presenting for sternal pain worse with movement.  There is no exertional component.  Lying down does not make the pain worse.  Eating has no effect on the pain.  Tells me the pain last roughly 5 minutes and then resolves regardless to activity.  Denies any injury.  She is sexually active with men.  Has had protected sex since her last menstrual cycle ended.  She tells me "there is no way I could pregnant."  She has No Known Allergies.   She  has no past medical history on file.    She  reports that she has never smoked. She has never used smokeless tobacco. She reports that she does not drink alcohol or use drugs. She  has no sexual activity history on file. The patient  has no past surgical history on file.  Her family history is not on file.  Review of Systems  Constitutional: Negative for chills, diaphoresis and fever.  Eyes: Negative.   Respiratory: Negative for cough, hemoptysis, sputum production, shortness of breath and wheezing.   Cardiovascular: Positive for chest pain. Negative for orthopnea and leg swelling.  Gastrointestinal: Negative for nausea.  Skin: Negative for rash.  Neurological: Negative for dizziness, sensory change, speech change, focal weakness and headaches.    The problem list and medications were reviewed and updated by myself where necessary and exist elsewhere in the encounter.   OBJECTIVE:  BP 120/66 (BP Location: Left Arm, Patient Position: Sitting, Cuff Size: Large)   Pulse (!) 103   Temp 98.2 F (36.8 C) (Oral)   Ht 5\' 4"  (1.626 m)   Wt 215 lb 12.8 oz (97.9 kg)   SpO2 100%   BMI 37.04 kg/m    Wt Readings from Last 3 Encounters:  11/02/17 215 lb 12.8 oz (97.9 kg)  11/23/16 225 lb 9.6 oz (102.3 kg)  09/06/15 207 lb (93.9 kg)   Temp Readings from Last 3 Encounters:  11/02/17 98.2 F (36.8 C) (Oral)  11/23/16 98.9 F (37.2 C) (Oral)    09/06/15 99 F (37.2 C) (Oral)   BP Readings from Last 3 Encounters:  11/02/17 120/66  11/23/16 133/83  09/06/15 118/68   Pulse Readings from Last 3 Encounters:  11/02/17 (!) 103  11/23/16 (!) 115  09/06/15 (!) 102     Physical Exam  Constitutional: She is active.  Non-toxic appearance.  Cardiovascular: Normal rate, regular rhythm, S1 normal, S2 normal, normal heart sounds and intact distal pulses. Exam reveals no gallop, no friction rub and no decreased pulses.  No murmur heard. Pulmonary/Chest: Effort normal. No stridor. No tachypnea. No respiratory distress. She has no wheezes. She has no rales.    Abdominal: She exhibits no distension.  Musculoskeletal: She exhibits no edema.  Neurological: She is alert.  Skin: Skin is warm and dry. She is not diaphoretic. No pallor.    No results found for this or any previous visit (from the past 72 hour(s)).  Dg Chest 2 View  Result Date: 11/02/2017 CLINICAL DATA:  Chest pain. EXAM: CHEST - 2 VIEW COMPARISON:  Chest x-ray dated September 06, 2015. FINDINGS: The heart size and mediastinal contours are within normal limits. Both lungs are clear. The visualized skeletal structures are unremarkable. IMPRESSION: Normal chest x-ray. Electronically Signed   By: Obie Dredge M.D.   On: 11/02/2017 15:28   EKG: Normal sinus rhythm,  normal axis,  negative for LVH, signs of ischemia and infarction.  ASSESSMENT AND PLAN:  Christina Whitney was seen today for chest pain.  Diagnoses and all orders for this visit:  Chest pain, unspecified type EKG and chest x-ray both reassuring.  Most likely musculoskeletal etiology as suspected from HPI.  She is young and has no risk factors for heart disease.  She is not taking estrogen.  She is not pregnant.  No family history of DVT/PE or heart disease.  She has a tachycardia however this is not new.  I will treat her with an NSAID and advised that she come back if her symptoms are worsening.  Advise she come back in  1-2 weeks if she is not getting better. -     EKG 12-Lead -     DG Chest 2 View; Future -     POCT urine pregnancy    The patient is advised to call or return to clinic if she does not see an improvement in symptoms, or to seek the care of the closest emergency department if she worsens with the above plan.   Deliah BostonMichael Clark, MHS, PA-C [facility] at [facility] 11/02/2017 3:39 PM

## 2017-11-02 NOTE — Patient Instructions (Addendum)
Please take the NSAID as prescribed for at least 2 weeks.  If at any point you feel your symptoms are worsening please come back.  If in 2 weeks you are not better please come back.  IF you received an x-ray today, you will receive an invoice from [facility]. Please contact [facility] at [phone] with questions or concerns regarding your invoice.   IF you received labwork today, you will receive an invoice from [facility]. Please contact [facility] at [phone] with questions or concerns regarding your invoice.   Our billing staff will not be able to assist you with questions regarding bills from these companies.  You will be contacted with the lab results as soon as they are available. The fastest way to get your results is to activate your My Chart account. Instructions are located on the last page of this paperwork. If you have not heard from us regarding the results in 2 weeks, please contact this office.

## 2017-12-24 ENCOUNTER — Ambulatory Visit: Payer: Self-pay | Admitting: *Deleted

## 2017-12-24 NOTE — Telephone Encounter (Signed)
Patient denies any abdominal pain / cramps or heavy bleeding at this time.Patient reports a history of irregular periods. She is  speaking in complete sentences and seems in no acute distress.Pt has an appointment for tomorrow,with Chelle Jefferey. Patient advised if  bleeding became heavy or if she developed any abdominal cramps or dizziness, she should call back or if severe call 911. Patient verbalizes knowledge of plan of care.  Reason for Disposition . Periods last > 7 days  Answer Assessment - Initial Assessment Questions 1. AMOUNT: "Describe the bleeding that you are having."    - SPOTTING: spotting, or pinkish / brownish mucous discharge; does not fill panti-liner or pad    - MILD:  less than 1 pad / hour; less than patient's usual menstrual bleeding   - MODERATE: 1-2 pads / hour; small-medium blood clots (e.g., pea, grape, small coin)    - SEVERE: soaking 2 or more pads/hour for 2 or more hours; bleeding not contained by pads or continuous red blood from vagina; large blood clots (e.g., golf ball, large coin)        MILD 2. ONSET: "When did the bleeding begin?" "Is it continuing now?"       29  DAYS   3. MENSTRUAL PERIOD: "When was the last normal menstrual period?" "How is this different than your period?"       April 22   4. REGULARITY: "How regular are your periods?"      Irregular  5. ABDOMINAL PAIN: "Do you have any pain?" "How bad is the pain?"  (e.g., Scale 1-10; mild, moderate, or severe)   - MILD (1-3): doesn't interfere with normal activities, abdomen soft and not tender to touch    - MODERATE (4-7): interferes with normal activities or awakens from sleep, tender to touch    - SEVERE (8-10): excruciating pain, doubled over, unable to do any normal activities      No  6. PREGNANCY: "Could you be pregnant?" "Are you sexually active?" "Did you recently give birth?"      No   -  Not sexually  Active   7. BREASTFEEDING: "Are you breastfeeding?"     No 8. HORMONES: "Are you  taking any hormone medications, prescription or OTC?" (e.g., birth control pills, estrogen)     No 9. BLOOD THINNERS: "Do you take any blood thinners?" (e.g., Coumadin/warfarin, Pradaxa/dabigatran, aspirin)       No   10. CAUSE: "What do you think is causing the bleeding?" (e.g., recent gyn surgery, recent gyn procedure; known bleeding disorder, cervical cancer, polycystic ovarian disease, fibroids)         No 11. HEMODYNAMIC STATUS: "Are you weak or feeling lightheaded?" If so, ask: "Can you stand and walk normally?"       No  - Yes can stand and walk normally   12. OTHER SYMPTOMS: "What other symptoms are you having with the bleeding?" (e.g., passed tissue, vaginal discharge, fever, menstrual-type cramps)      No  Protocols used: VAGINAL BLEEDING - ABNORMAL-A-AH

## 2017-12-25 ENCOUNTER — Ambulatory Visit: Payer: BLUE CROSS/BLUE SHIELD | Admitting: Physician Assistant

## 2019-04-08 IMAGING — DX DG FOOT COMPLETE 3+V*R*
3 series · 3 of 3 positions shown · non-contrast
Comparison: None.

CLINICAL DATA: Hip pain for 2 days.

EXAM:
RIGHT FOOT COMPLETE - 3+ VIEW

[foot ap]
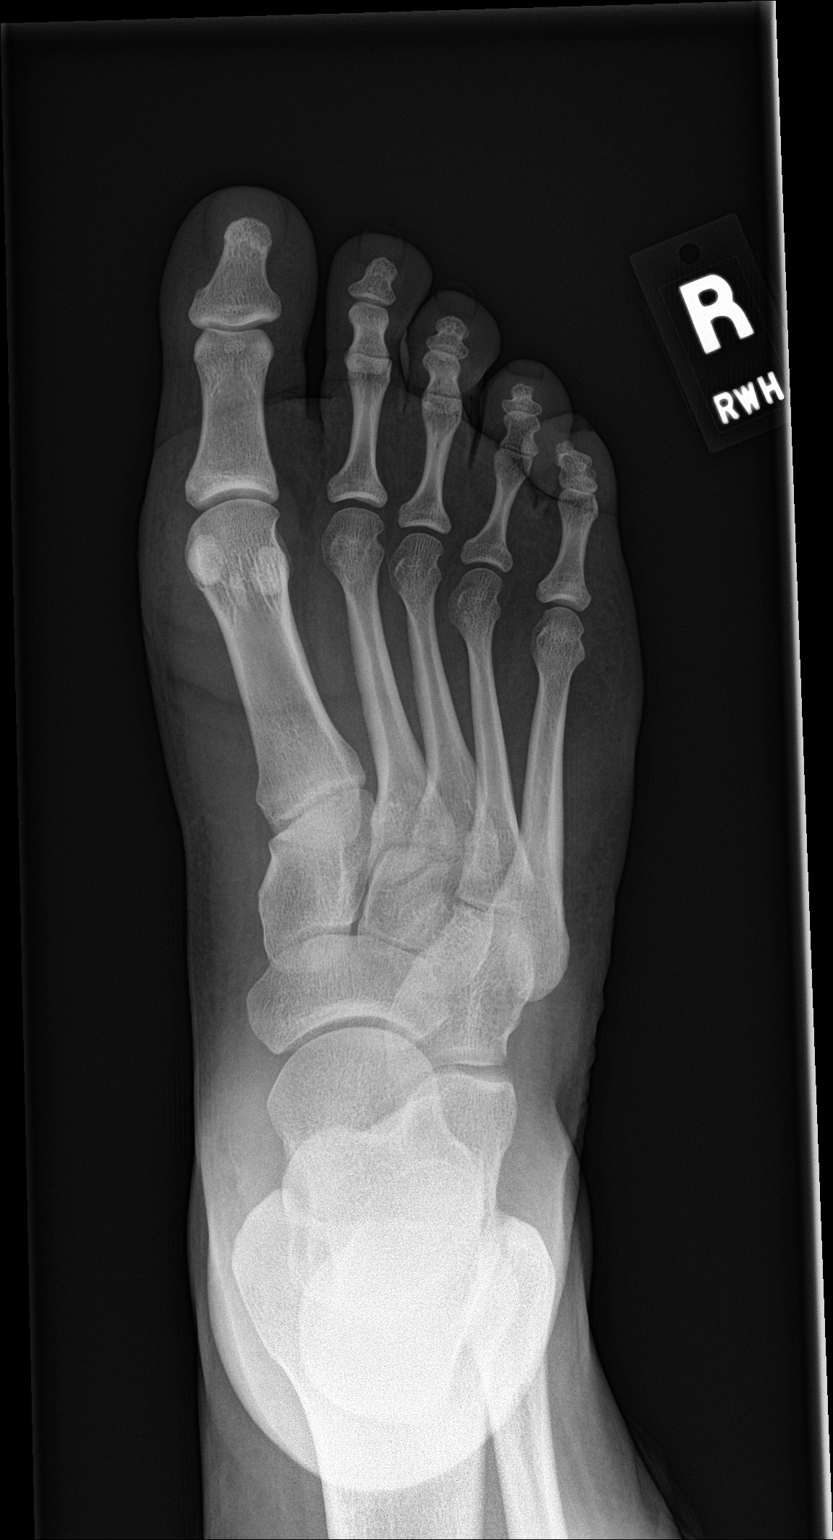

[foot obl]
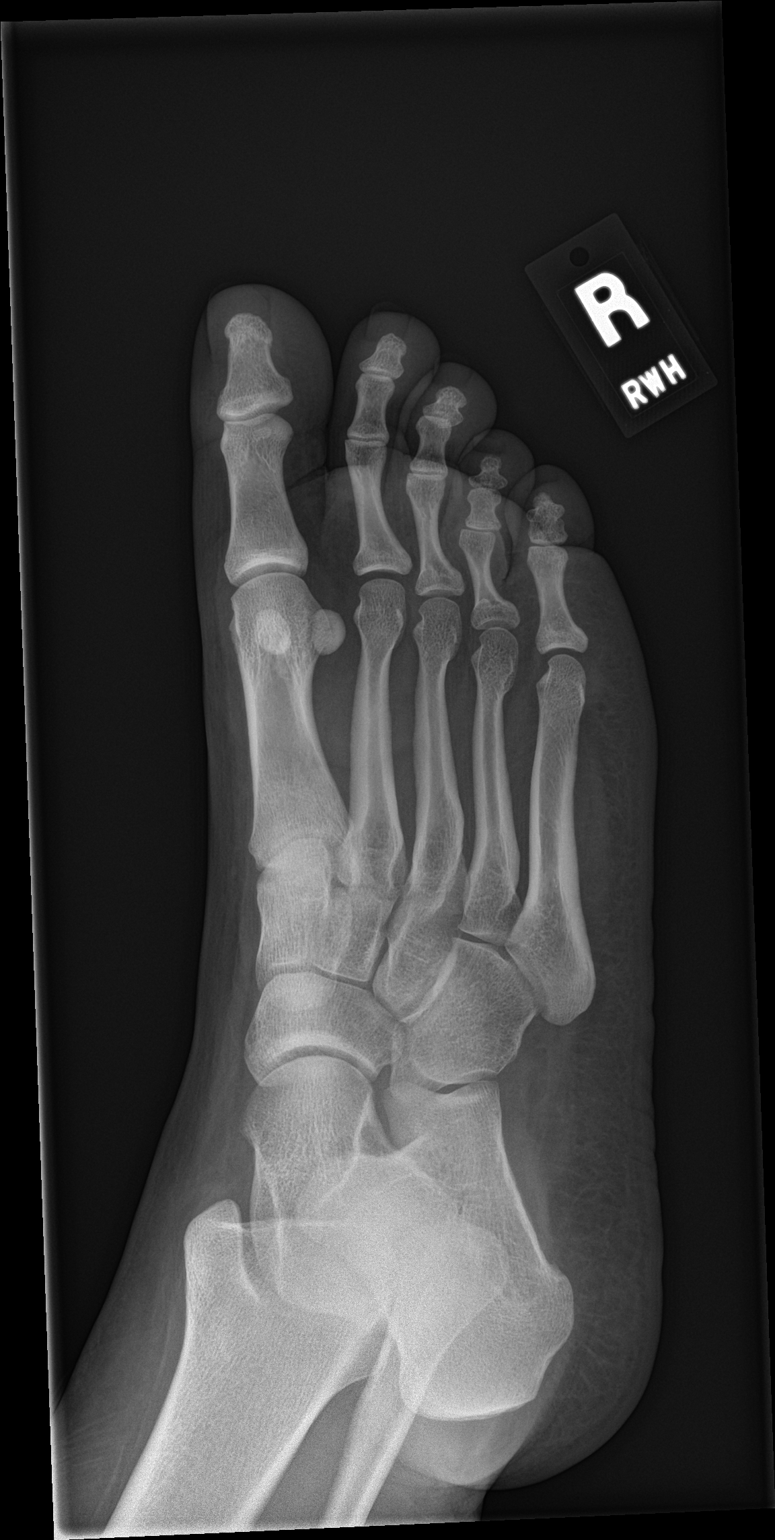

[foot lat]
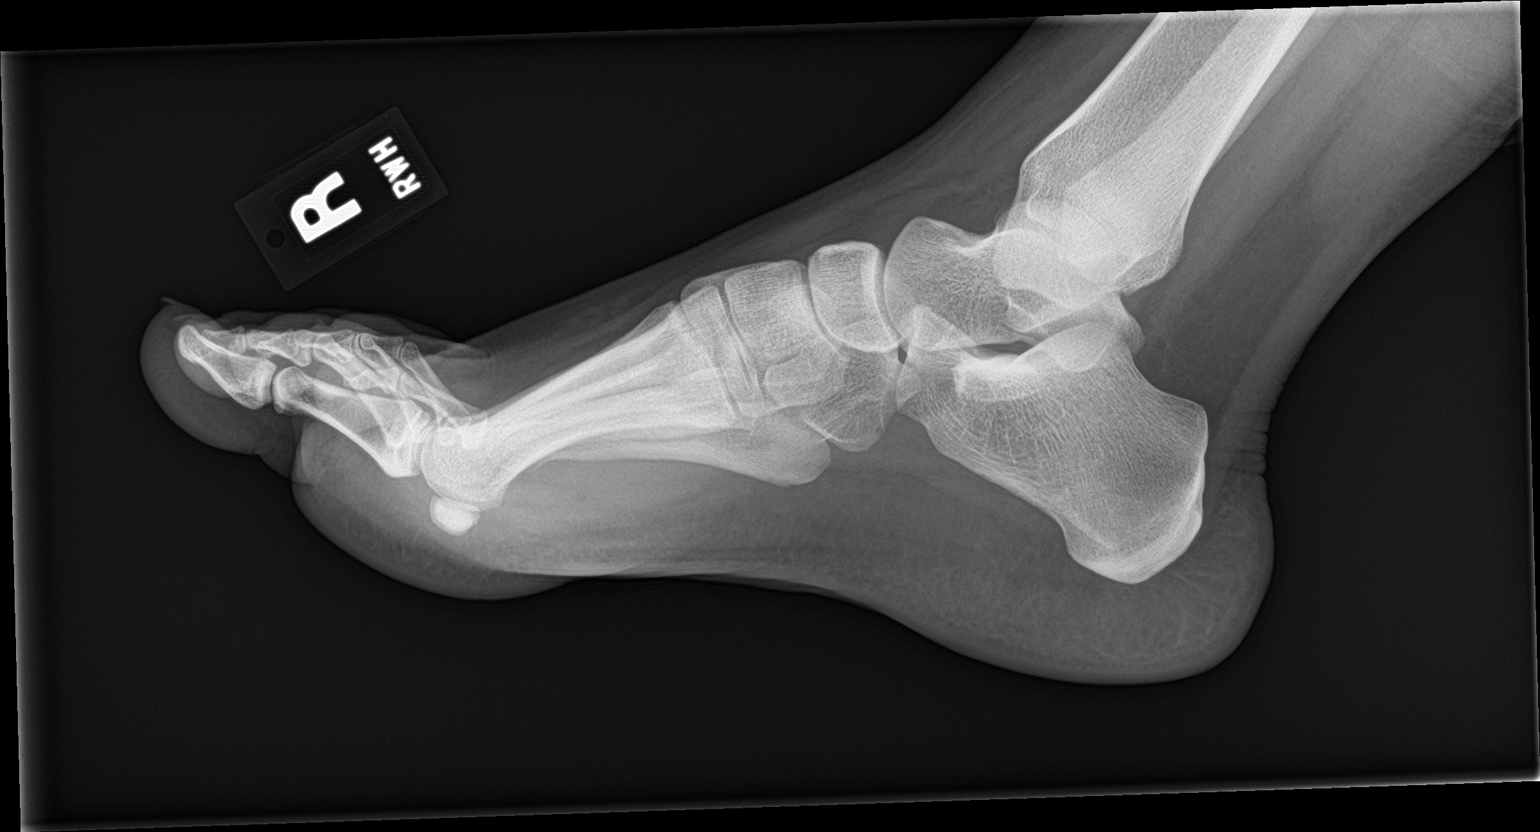

[3 of 3 positions shown; findings below may reference images not displayed]

FINDINGS: There is no evidence of fracture or dislocation. There is no
evidence of arthropathy or other focal bone abnormality. Soft
tissues are unremarkable.
IMPRESSION: Negative right foot radiographs.

## 2020-03-17 IMAGING — DX DG CHEST 2V
2 series · 2 of 2 positions shown · non-contrast
Comparison: Chest x-ray dated September 06, 2015.

CLINICAL DATA: Chest pain.

EXAM:
CHEST - 2 VIEW

[chest pa]
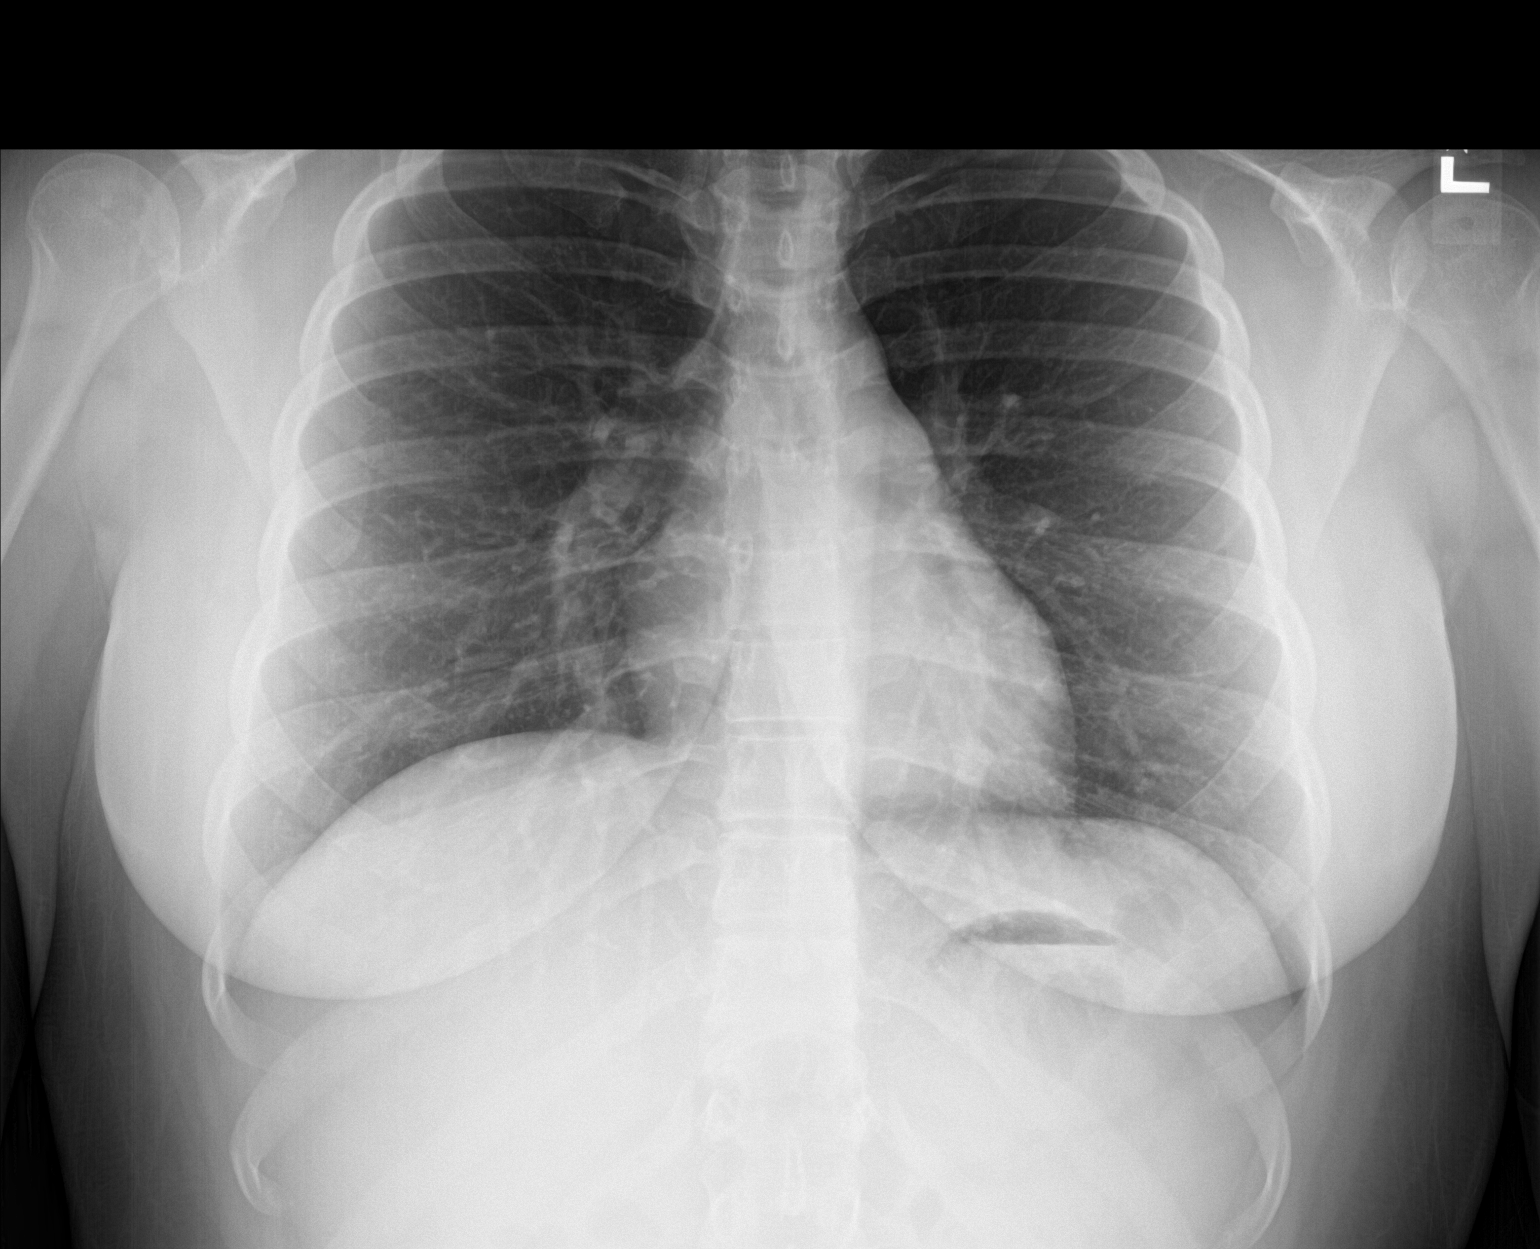

[chest lat]
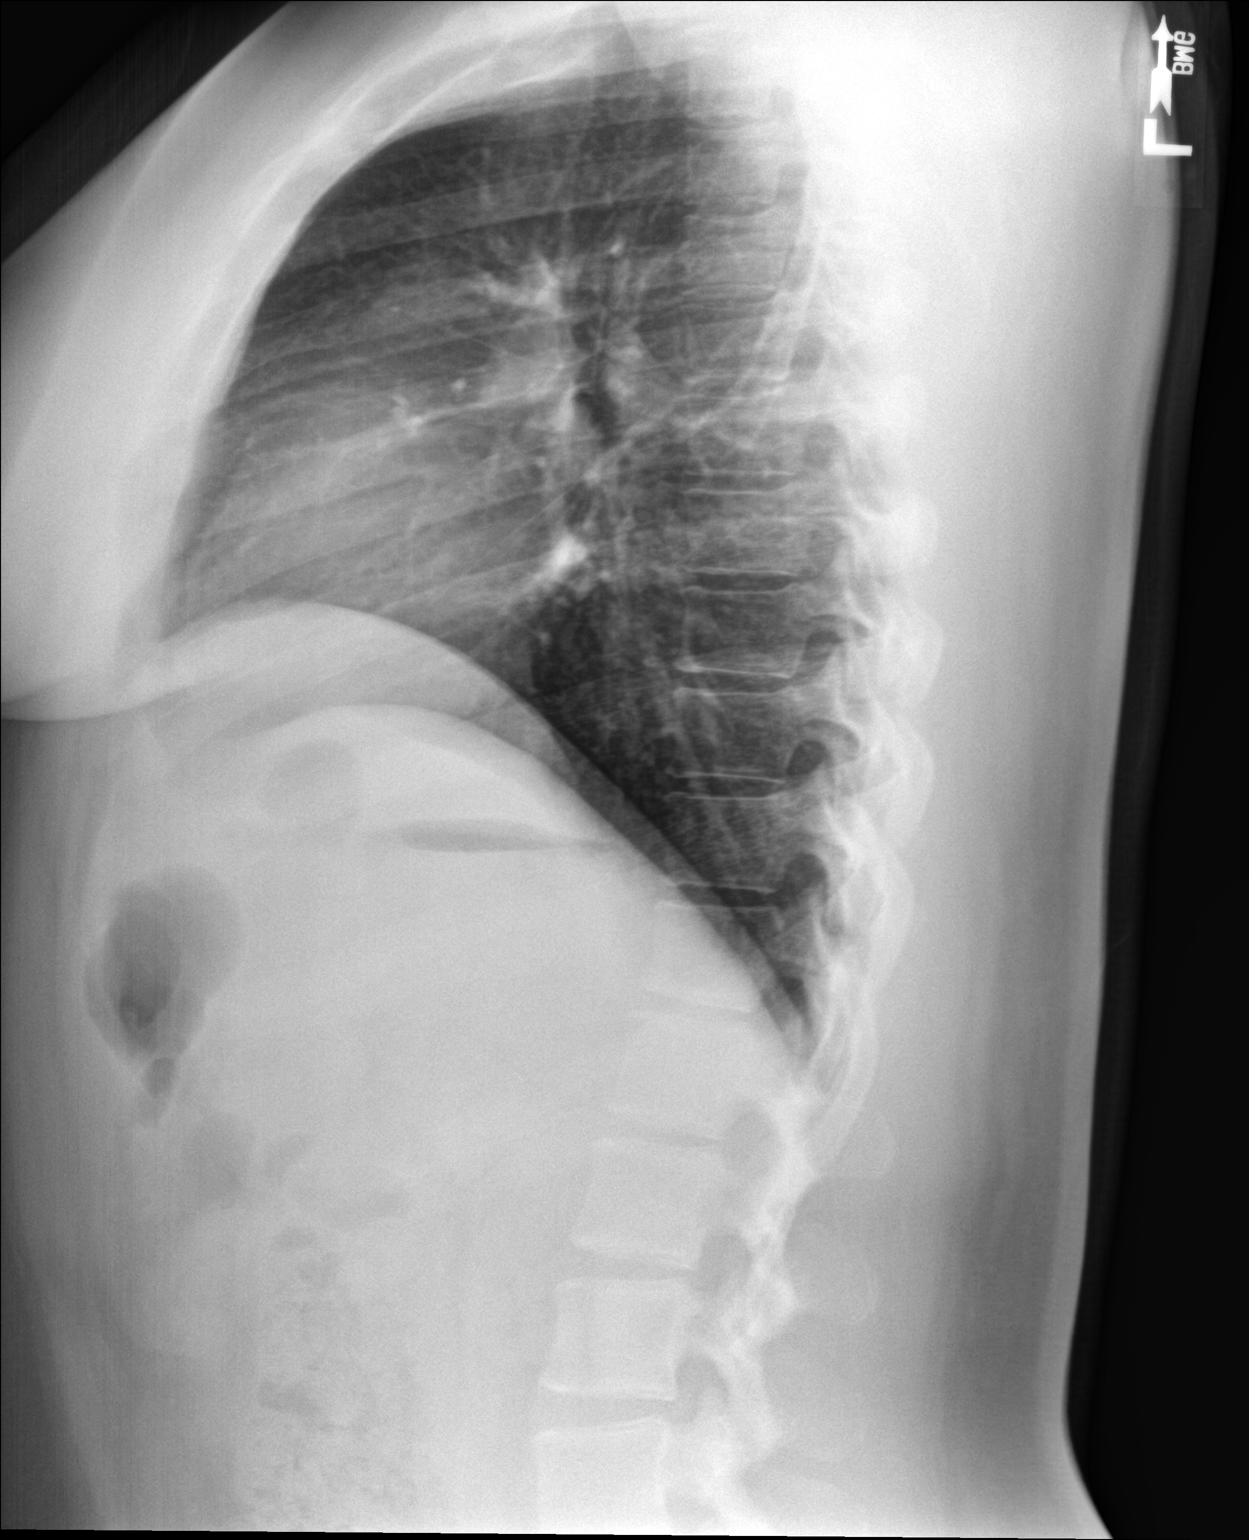

[2 of 2 positions shown; findings below may reference images not displayed]

FINDINGS: The heart size and mediastinal contours are within normal limits.
Both lungs are clear. The visualized skeletal structures are
unremarkable.
IMPRESSION: Normal chest x-ray.
